# Patient Record
Sex: Male | Born: 1990 | Race: White | Hispanic: No | Marital: Single | Smoking: Never smoker
Health system: Southern US, Community
[De-identification: ages and names within clinical notes are randomized; demographics above are authoritative.]

---
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
## 2011-03-31 ENCOUNTER — Ambulatory Visit (HOSPITAL_COMMUNITY)
Admission: RE | Admit: 2011-03-31 | Discharge: 2011-03-31 | Disposition: A | Discharge status: Home / Self Care | Payer: PRIVATE HEALTH INSURANCE | Source: Ambulatory Visit | Attending: Family Medicine | Admitting: Family Medicine

## 2011-03-31 ENCOUNTER — Other Ambulatory Visit (HOSPITAL_COMMUNITY): Payer: Self-pay | Admitting: Family Medicine

## 2011-03-31 DIAGNOSIS — J069 Acute upper respiratory infection, unspecified: Secondary | ICD-10-CM

## 2011-03-31 DIAGNOSIS — R059 Cough, unspecified: Secondary | ICD-10-CM | POA: Insufficient documentation

## 2011-03-31 DIAGNOSIS — R0789 Other chest pain: Secondary | ICD-10-CM | POA: Insufficient documentation

## 2011-03-31 DIAGNOSIS — R05 Cough: Secondary | ICD-10-CM

## 2011-03-31 DIAGNOSIS — R509 Fever, unspecified: Secondary | ICD-10-CM | POA: Insufficient documentation

## 2011-03-31 DIAGNOSIS — R0602 Shortness of breath: Secondary | ICD-10-CM | POA: Insufficient documentation

## 2011-09-25 IMAGING — CR DG CHEST 2V
2 series · 2 of 2 positions shown · non-contrast
Comparison: None

CLINICAL DATA: Dry cough, shortness of breath, chest soreness,
fever

CHEST - 2 VIEW

[view not recorded (1 of 2)]
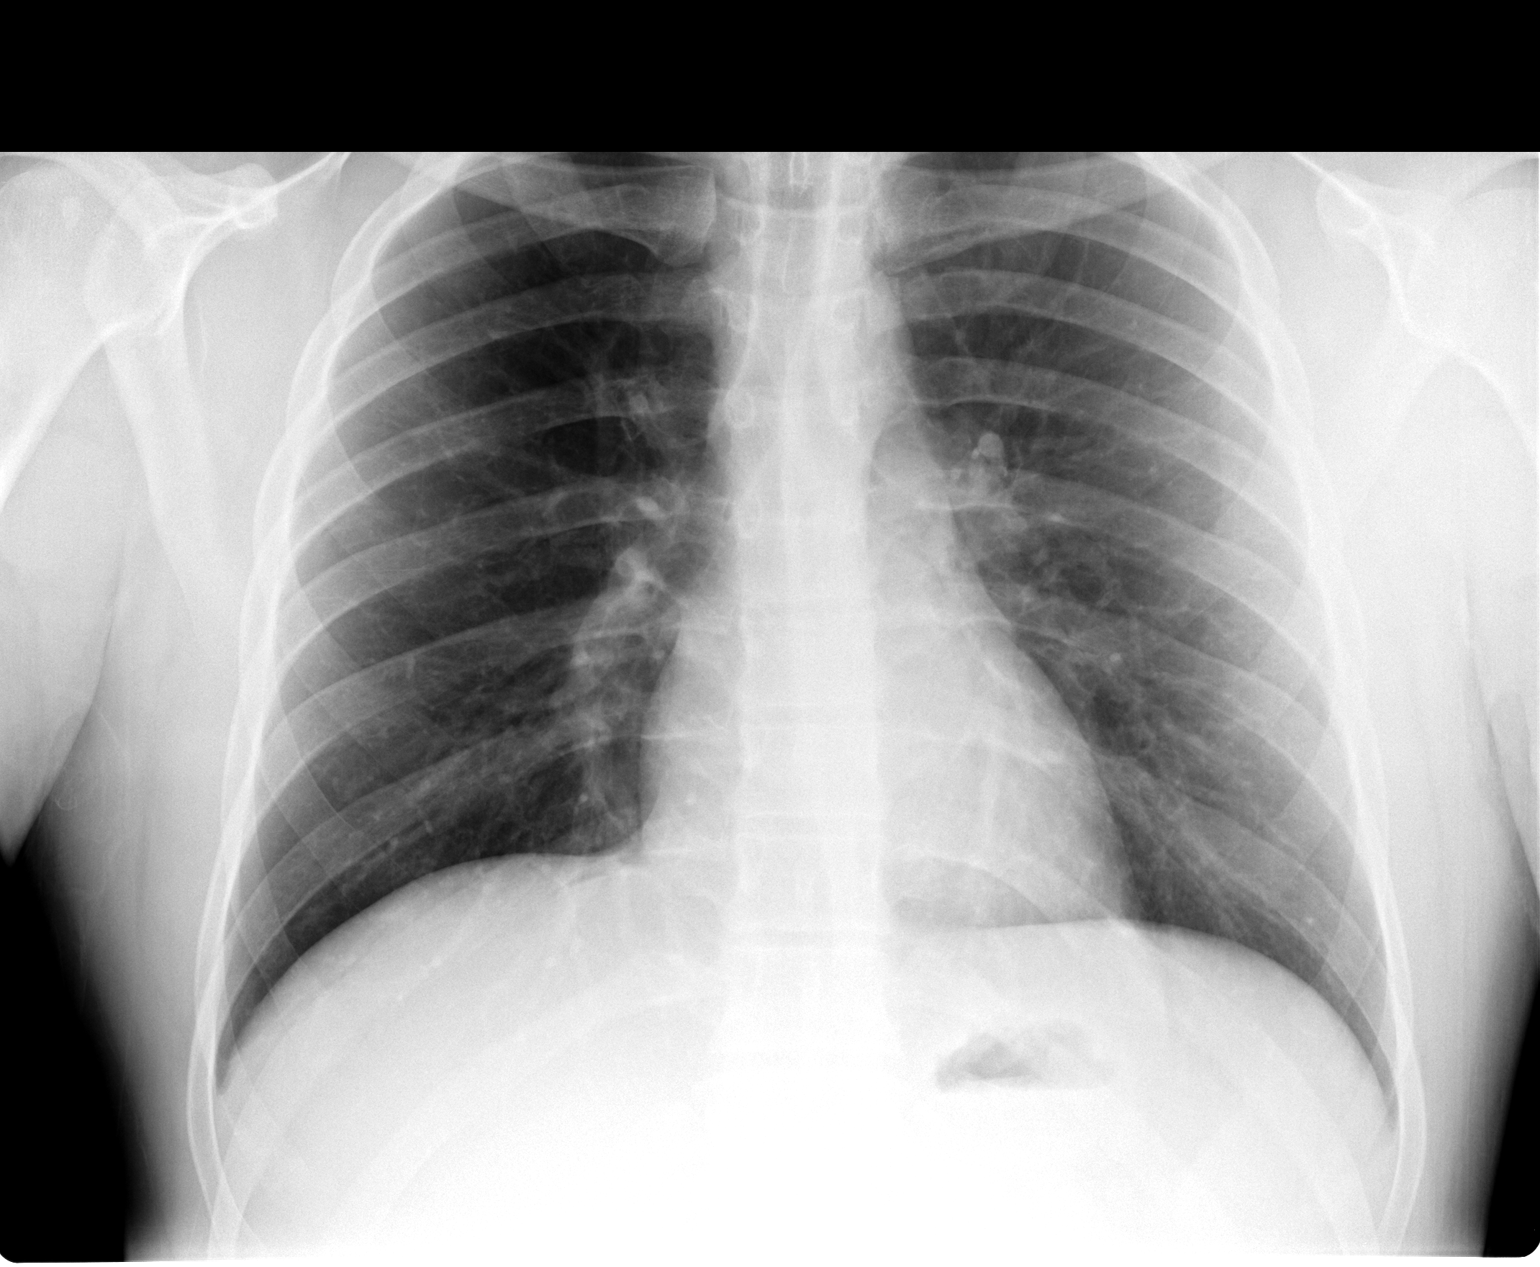

[view not recorded (2 of 2)]
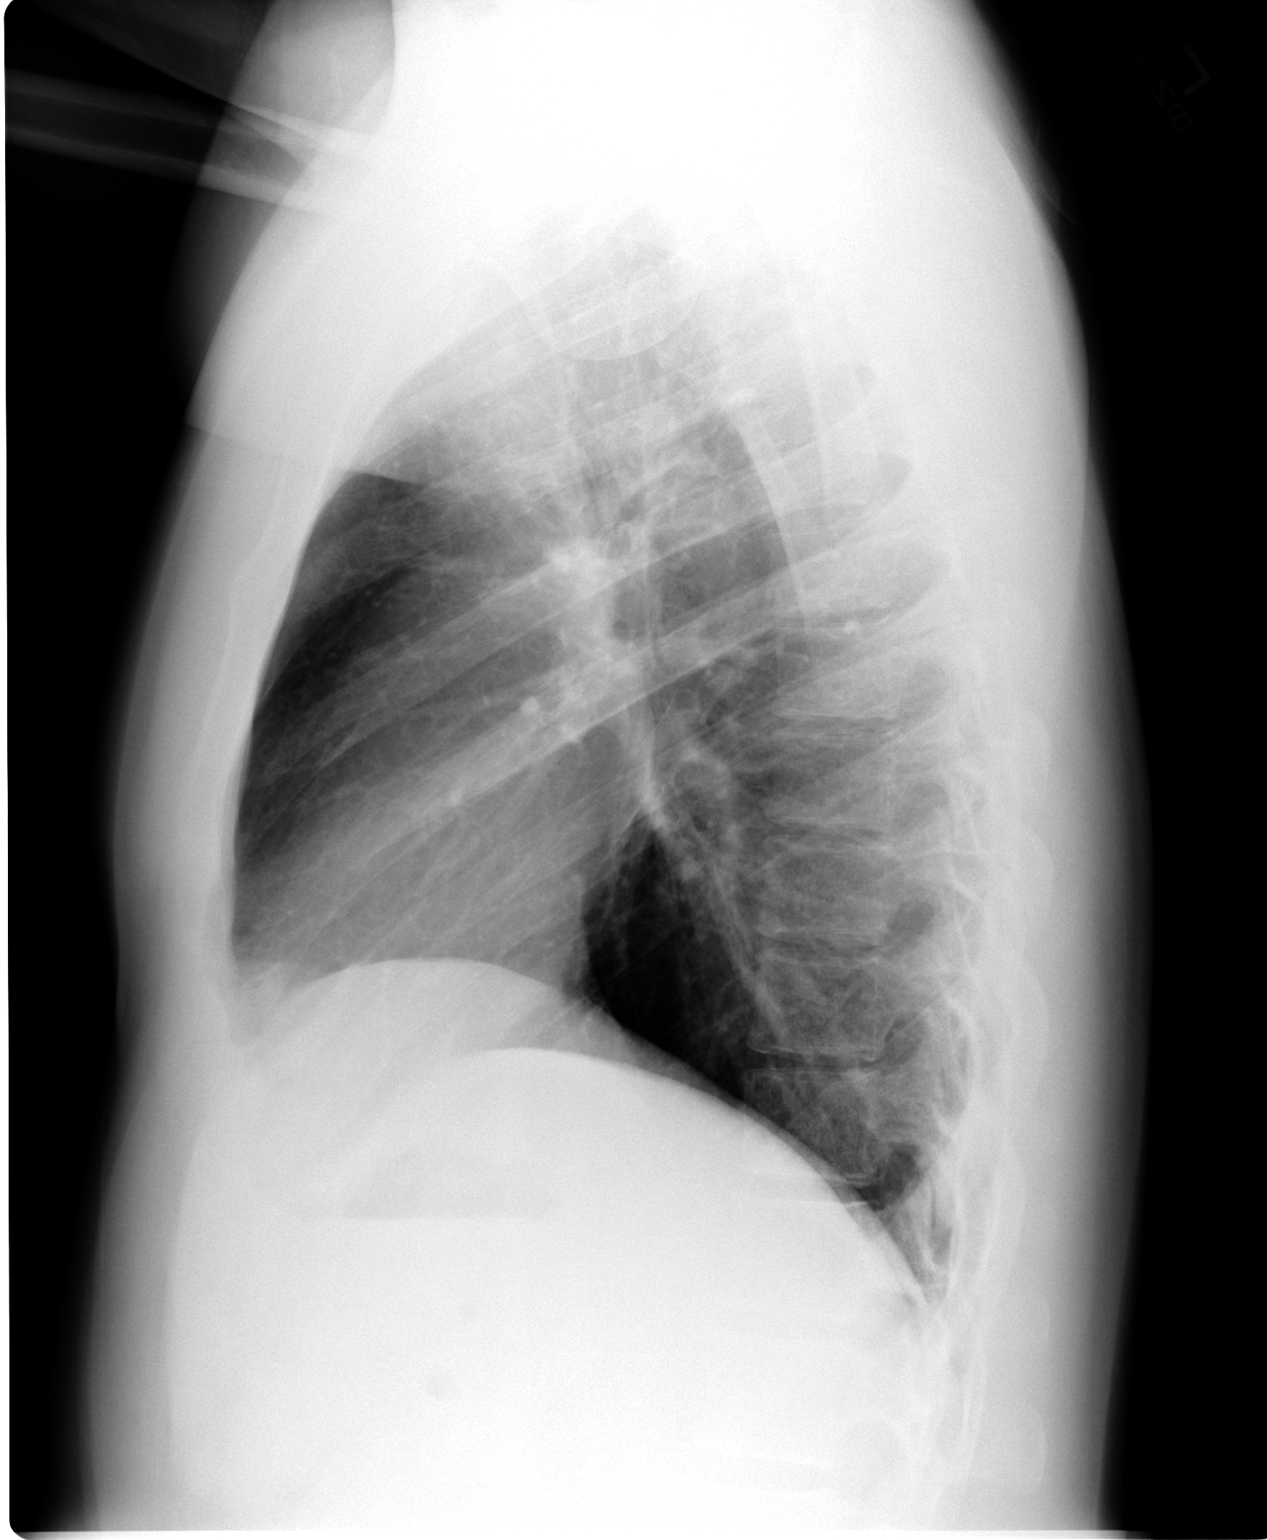

[2 of 2 positions shown; findings below may reference images not displayed]

FINDINGS: Normal heart size, mediastinal contours, and pulmonary vascularity.
Lungs clear.
Bones unremarkable.
No pneumothorax.
IMPRESSION: Normal exam.

## 2012-11-05 ENCOUNTER — Ambulatory Visit: Payer: Self-pay

## 2012-11-05 ENCOUNTER — Other Ambulatory Visit: Payer: Self-pay | Admitting: Occupational Medicine

## 2012-11-05 DIAGNOSIS — R52 Pain, unspecified: Secondary | ICD-10-CM

## 2013-05-02 IMAGING — CR DG RIBS W/ CHEST 3+V*L*
3 series · 3 of 3 positions shown · non-contrast
Comparison: 03/31/2011.

CLINICAL DATA: Chest pain.  Lifting injury.  Heard a "pop" in the
chest.

LEFT RIBS AND CHEST - 3+ VIEW

[view not recorded (1 of 3)]
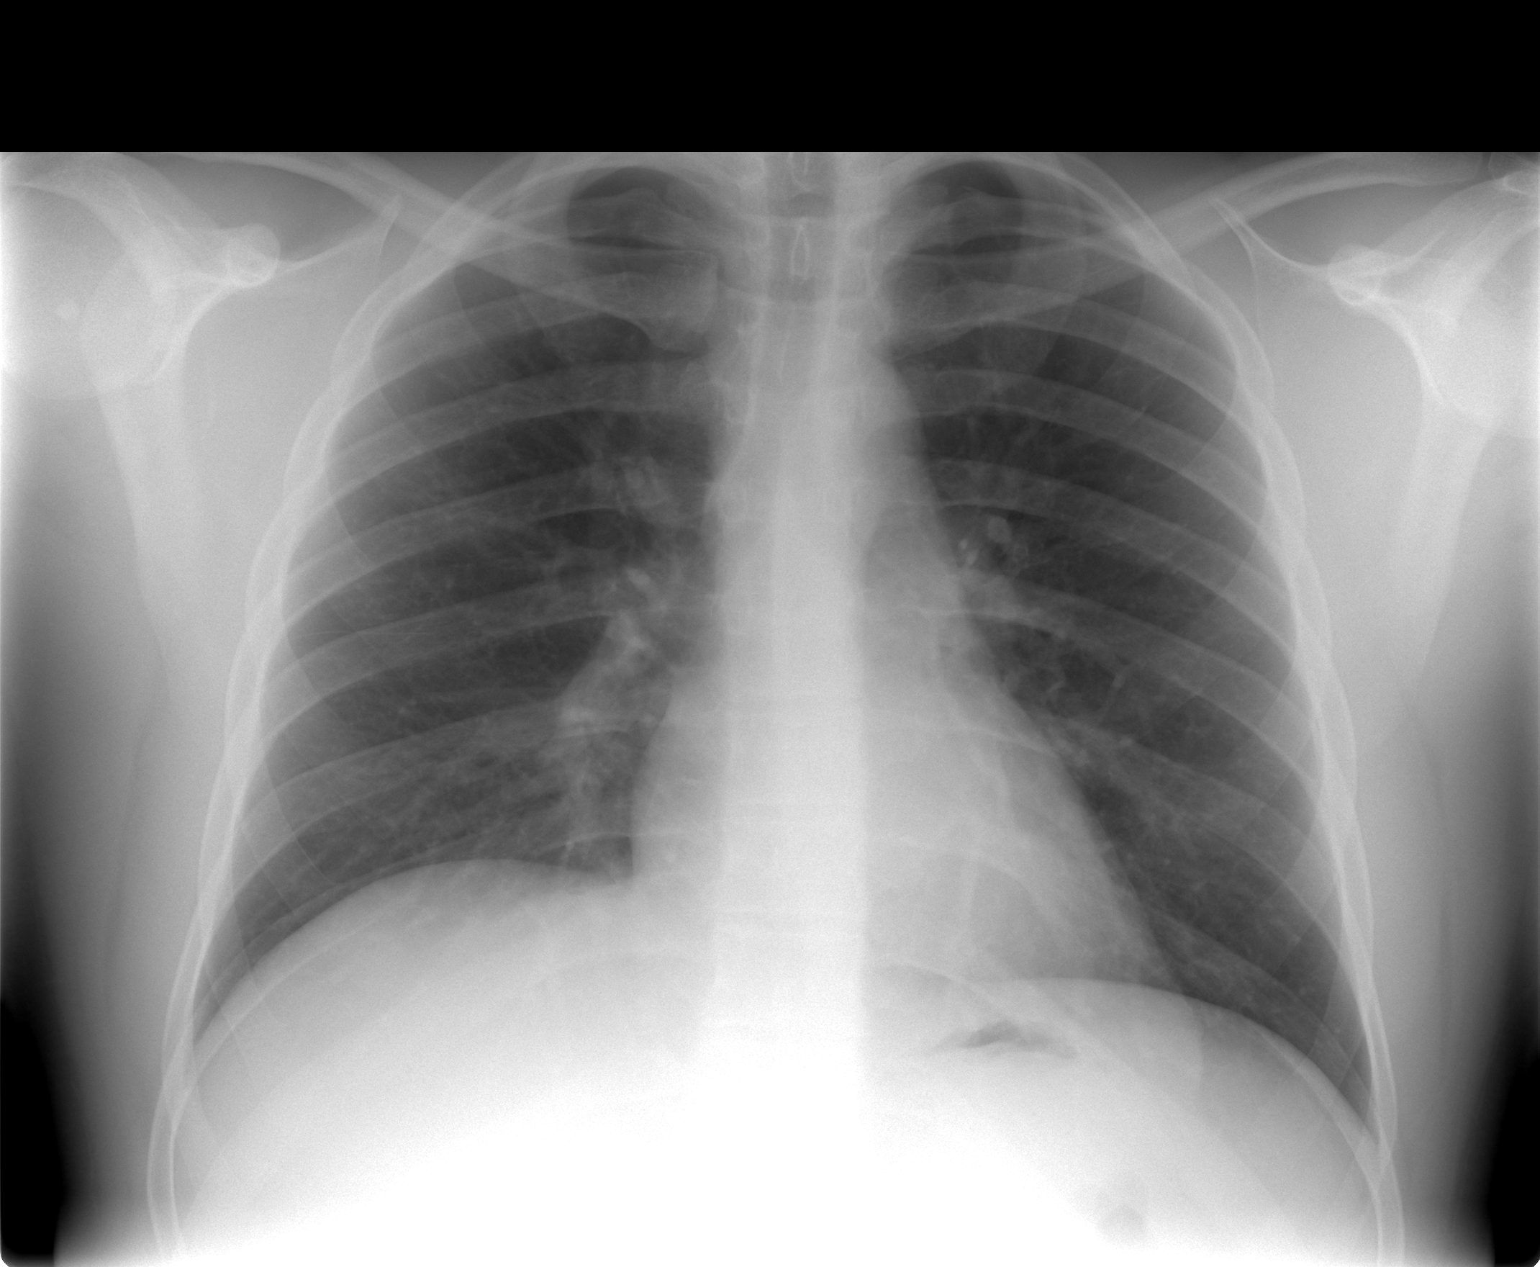

[view not recorded (2 of 3)]
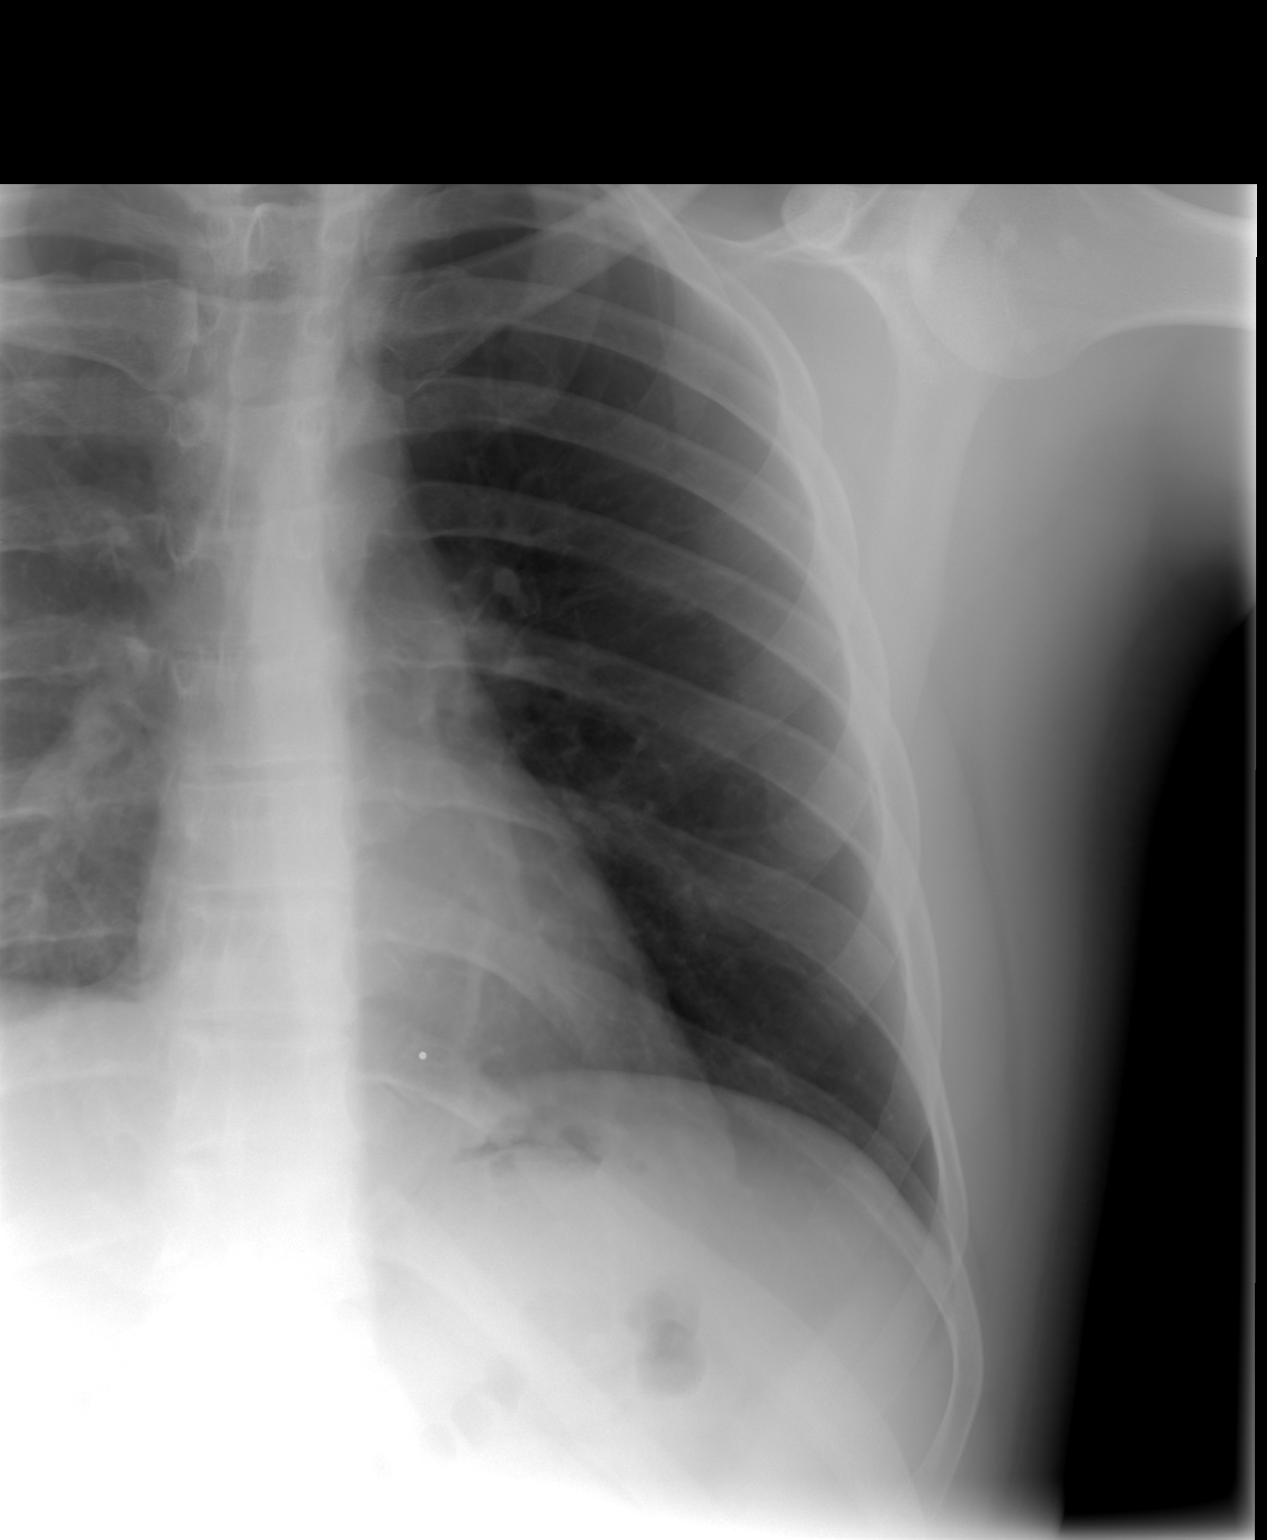

[view not recorded (3 of 3)]
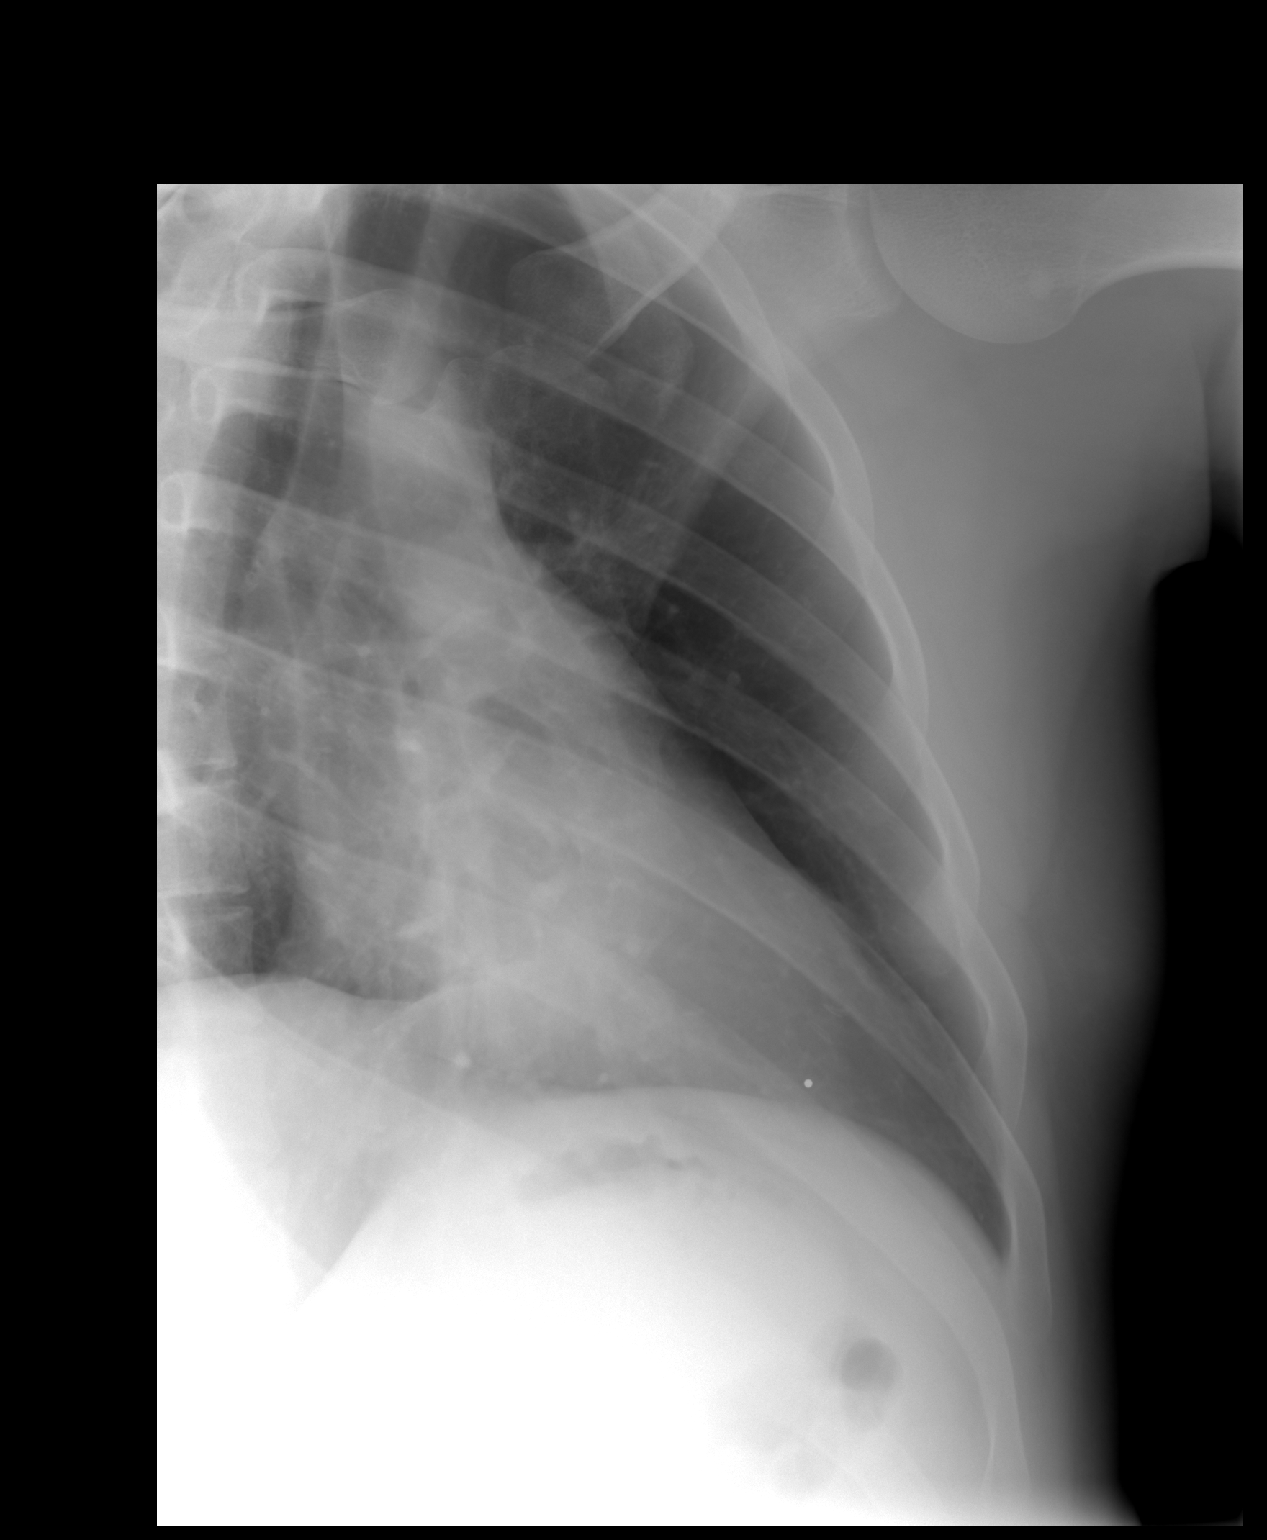

[3 of 3 positions shown; findings below may reference images not displayed]

FINDINGS: Cardiopericardial silhouette within normal limits.
Mediastinal contours normal. Trachea midline.  No airspace disease
or effusion.  No pneumothorax.  BB marker overlies the left
anterior fifth and sixth rib costochondral junction.  There is no
displaced rib fracture.
IMPRESSION: No acute abnormality.

## 2017-10-14 DIAGNOSIS — R6889 Other general symptoms and signs: Secondary | ICD-10-CM | POA: Diagnosis not present

## 2018-06-28 DIAGNOSIS — Z23 Encounter for immunization: Secondary | ICD-10-CM | POA: Diagnosis not present

## 2018-07-01 DIAGNOSIS — B961 Klebsiella pneumoniae [K. pneumoniae] as the cause of diseases classified elsewhere: Secondary | ICD-10-CM | POA: Diagnosis not present

## 2018-07-01 DIAGNOSIS — Z1611 Resistance to penicillins: Secondary | ICD-10-CM | POA: Diagnosis not present

## 2018-07-05 DIAGNOSIS — B961 Klebsiella pneumoniae [K. pneumoniae] as the cause of diseases classified elsewhere: Secondary | ICD-10-CM | POA: Diagnosis not present

## 2018-07-13 DIAGNOSIS — Z00111 Health examination for newborn 8 to 28 days old: Secondary | ICD-10-CM | POA: Diagnosis not present

## 2018-12-10 DIAGNOSIS — J029 Acute pharyngitis, unspecified: Secondary | ICD-10-CM | POA: Diagnosis not present

## 2019-03-22 ENCOUNTER — Telehealth: Payer: Self-pay

## 2019-03-22 DIAGNOSIS — S62307A Unspecified fracture of fifth metacarpal bone, left hand, initial encounter for closed fracture: Secondary | ICD-10-CM | POA: Diagnosis not present

## 2019-03-22 NOTE — Telephone Encounter (Signed)
Dr. Shona Needles office called wanting to get this patient in to see you. He had a xray done today([facility]) and it reads that he has a Comminuted fx proximal 5th metacarpal with several displaced fx fragments. Finger has been hurting him for 2 days per referral notes.  Please advise when you can see him

## 2019-03-23 ENCOUNTER — Other Ambulatory Visit: Payer: Self-pay

## 2019-03-23 ENCOUNTER — Ambulatory Visit (INDEPENDENT_AMBULATORY_CARE_PROVIDER_SITE_OTHER): Payer: BC Managed Care – PPO

## 2019-03-23 ENCOUNTER — Encounter: Payer: Self-pay | Admitting: Orthopedic Surgery

## 2019-03-23 ENCOUNTER — Ambulatory Visit: Payer: BC Managed Care – PPO | Admitting: Orthopedic Surgery

## 2019-03-23 VITALS — BP 147/97 | HR 68 | Temp 97.2°F | Ht 69.0 in | Wt 209.0 lb

## 2019-03-23 DIAGNOSIS — S62347A Nondisplaced fracture of base of fifth metacarpal bone. left hand, initial encounter for closed fracture: Secondary | ICD-10-CM

## 2019-03-23 DIAGNOSIS — S6292XA Unspecified fracture of left wrist and hand, initial encounter for closed fracture: Secondary | ICD-10-CM | POA: Diagnosis not present

## 2019-03-23 NOTE — Telephone Encounter (Signed)
See if you can get him in at 11 if not then 330

## 2019-03-23 NOTE — Patient Instructions (Addendum)
Work note OOW x 4 weeks     Cast or Splint Care, Adult Casts and splints are supports that are worn to protect broken bones and other injuries. A cast or splint may hold a bone still and in the correct position while it heals. Casts and splints may also help to ease pain, swelling, and muscle spasms. How to care for your cast   Do not stick anything inside the cast to scratch your skin.  Check the skin around the cast every day. Tell your doctor about any concerns.  You may put lotion on dry skin around the edges of the cast. Do not put lotion on the skin under the cast.  Keep the cast clean.  If the cast is not waterproof: ? Do not let it get wet. ? Cover it with a watertight covering when you take a bath or a shower. How to care for your splint   Wear it as told by your doctor. Take it off only as told by your doctor.  Loosen the splint if your fingers or toes tingle, get numb, or turn cold and blue.  Keep the splint clean.  If the splint is not waterproof: ? Do not let it get wet. ? Cover it with a watertight covering when you take a bath or a shower. Follow these instructions at home: Bathing  Do not take baths or swim until your doctor says it is okay. Ask your doctor if you can take showers. You may only be allowed to take sponge baths for bathing.  If your cast or splint is not waterproof, cover it with a watertight covering when you take a bath or shower. Managing pain, stiffness, and swelling  Move your fingers or toes often to avoid stiffness and to lessen swelling.  Raise (elevate) the injured area above the level of your heart while sitting or lying down. Safety  Do not use the injured limb to support your body weight until your doctor says that it is okay.  Use crutches or other assistive devices as told by your doctor. General instructions  Do not put pressure on any part of the cast or splint until it is fully hardened. This may take many  hours.  Return to your normal activities as told by your doctor. Ask your doctor what activities are safe for you.  Keep all follow-up visits as told by your doctor. This is important. Contact a doctor if:  Your cast or splint gets damaged.  The skin around the cast gets red or raw.  The skin under the cast is very itchy or painful.  Your cast or splint feels very uncomfortable.  Your cast or splint is too tight or too loose.  Your cast becomes wet or it starts to have a soft spot or area.  You get an object stuck under your cast. Get help right away if:  Your pain gets worse.  The injured area tingles, gets numb, or turns blue and cold.  The part of your body above or below the cast is swollen and it turns a different color (is discolored).  You cannot feel or move your fingers or toes.  There is fluid leaking through the cast.  You have very bad pain or pressure under the cast.  You have trouble breathing.  You have shortness of breath.  You have chest pain. This information is not intended to replace advice given to you by your health care provider. Make sure you discuss any  questions you have with your health care provider. Document Released: 01/01/2011 Document Revised: 12/22/2018 Document Reviewed: 08/22/2016 Elsevier Patient Education  2020 [employer]

## 2019-03-23 NOTE — Progress Notes (Signed)
Marcus Alvarez  03/23/2019  HISTORY SECTION :  Chief Complaint  Patient presents with  . Hand Injury    Left hand fracture, referred by Dr. Jomarie Longs, [place] 03-21-19.   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 Malva Cogan with x-rays   Review of Systems  Musculoskeletal: Positive for joint pain.  All other systems reviewed and are negative.    No past medical history on file.  Patient listed and confirmed no medical problem such as hypertension diabetes  Patient listed no surgeries  No Known Allergies   Current Outpatient Medications:  .  nabumetone (RELAFEN) 750 MG tablet, Take 750 mg by mouth 2 (two) times daily., Disp: , Rfl:    PHYSICAL EXAM SECTION: 1) BP (!) 147/97   Pulse 68   Temp (!) 97.2 F (36.2 C)   Ht 5\' 9"  (1.753 m)   Wt 209 lb (94.8 kg)   BMI 30.86 kg/m   Body mass index is 30.86 kg/m. General appearance: Well-developed well-nourished no gross deformities  2) Cardiovascular normal pulse and perfusion in all 4 extremities normal color without edema  3) Neurologically deep tendon reflexes are equal and normal, no sensation loss or deficits no pathologic reflexes  4) Psychological: Awake alert and oriented x3 mood and affect normal  5) Skin no lacerations or ulcerations no nodularity no palpable masses, no erythema or nodularity  6) Musculoskeletal:   Right hand for comparison no tenderness or swelling full range of motion normal alignment and stability normal strength normal muscle tone normal skin normal pulse normal sensation  Left hand is swollen and tender at the base of the fifth metacarpal no rotatory malalignment is no dorsal displacement grip strength is poor secondary to pain skin is intact with no laceration or  opening neurovascular exam is intact   MEDICAL DECISION SECTION:  Encounter Diagnoses  Name Primary?  . Closed fracture of left hand, initial encounter Yes  . Closed nondisplaced fracture of base of fifth metacarpal bone of left hand, initial encounter     Imaging We have 3 x-rays on a disc but no true lateral to assess dorsal displacement so a true lateral x-ray was obtained there is no dorsal displacement there is a comminuted fracture at the [facility] joint at the base of the fifth metacarpal   Plan:  (Rx., Inj., surg., Frx, MRI/CT, XR:2) Short arm cast applied Cast x4 weeks x-ray out of plaster No work for 4 weeks At 4-week appointment assess possibility of going back to work in a splint    3:41 PM

## 2019-04-20 ENCOUNTER — Encounter: Payer: Self-pay | Admitting: Orthopedic Surgery

## 2019-04-20 ENCOUNTER — Other Ambulatory Visit: Payer: Self-pay

## 2019-04-20 ENCOUNTER — Ambulatory Visit: Payer: BC Managed Care – PPO

## 2019-04-20 ENCOUNTER — Ambulatory Visit (INDEPENDENT_AMBULATORY_CARE_PROVIDER_SITE_OTHER): Payer: BC Managed Care – PPO | Admitting: Orthopedic Surgery

## 2019-04-20 DIAGNOSIS — S6292XD Unspecified fracture of left wrist and hand, subsequent encounter for fracture with routine healing: Secondary | ICD-10-CM

## 2019-04-20 NOTE — Patient Instructions (Signed)
RETURN TO WORK NOTE (AND HE WILL BRING RTW PAPER TO BE FILLED OUT)

## 2019-04-20 NOTE — Progress Notes (Signed)
Fracture care  Patient presents for fracture of his left hand base fifth metacarpal  We saw him on July 8 he was injured on July 6 he took his cast off 2 days ago he comes in for his appointment  X-rays fracture healed no displacement  Exam clinical exam shows a dorsal wrist ganglion no tenderness at the fracture site full range of motion no malalignment  Plan  Return to work immediately or the next day  As far as the ganglion goes it can be electively removed at any point that it becomes symptomatic

## 2019-04-25 ENCOUNTER — Telehealth: Payer: Self-pay | Admitting: Orthopedic Surgery

## 2019-04-25 NOTE — Telephone Encounter (Signed)
Employer form "Return to work slip" faxed to [employer] [phone] as per request; patient aware.
